# Patient Record
Sex: Female | Born: 2000 | Race: White | Hispanic: No | Marital: Single | Smoking: Never smoker
Health system: Southern US, Community
[De-identification: ages and names within clinical notes are randomized; demographics above are authoritative.]

---

## 2019-07-07 ENCOUNTER — Ambulatory Visit: Payer: Self-pay | Attending: Internal Medicine

## 2020-03-10 ENCOUNTER — Emergency Department (INDEPENDENT_AMBULATORY_CARE_PROVIDER_SITE_OTHER): Payer: BC Managed Care – PPO

## 2020-03-10 ENCOUNTER — Emergency Department (INDEPENDENT_AMBULATORY_CARE_PROVIDER_SITE_OTHER)
Admission: EM | Admit: 2020-03-10 | Discharge: 2020-03-10 | Disposition: A | Discharge status: Home / Self Care | Payer: BC Managed Care – PPO | Source: Home / Self Care | Attending: Family Medicine | Admitting: Family Medicine

## 2020-03-10 ENCOUNTER — Other Ambulatory Visit: Payer: Self-pay

## 2020-03-10 DIAGNOSIS — R059 Cough, unspecified: Secondary | ICD-10-CM | POA: Diagnosis not present

## 2020-03-10 DIAGNOSIS — R053 Chronic cough: Secondary | ICD-10-CM | POA: Diagnosis not present

## 2020-03-10 MED ORDER — METHYLPREDNISOLONE SODIUM SUCC 125 MG IJ SOLR
80.0000 mg | Freq: Once | INTRAMUSCULAR | Status: AC
  Administered 2020-03-10: 80 mg via INTRAMUSCULAR

## 2020-03-10 MED ORDER — BENZONATATE 200 MG PO CAPS: 200.0000 mg | ORAL_CAPSULE | Freq: Three times a day (TID) | ORAL | 0 refills | Status: AC | PRN

## 2020-03-10 MED ORDER — AZITHROMYCIN 250 MG PO TABS: ORAL_TABLET | ORAL | 0 refills | Status: DC

## 2020-03-10 MED ORDER — PREDNISONE 20 MG PO TABS: ORAL_TABLET | ORAL | 0 refills | Status: DC

## 2020-03-10 NOTE — ED Provider Notes (Signed)
[facility]    CSN: [id number] Arrival date & time: 03/10/20  1054      History   Chief Complaint Chief Complaint  Patient presents with  . Cough    HPI Jodi Carter is a 19 y.o. female.   About 6 weeks ago patient developed URI-like symptoms with initial sore throat and nasal congestion with copious nasal drainage that has persisted.  She was evaluated and treated last month with antibiotic but her cough has also persisted.  The cough has now become worse; she often coughs until she gags.  She denies fevers, chills, and sweats and pleuritic pain or shortness of breath  Her last Tdap was 03/19/2010 ([facility]).  The history is provided by the patient.    History reviewed. No pertinent past medical history.  There are no problems to display for this patient.   History reviewed. No pertinent surgical history.  OB History   No obstetric history on file.      Home Medications    Prior to Admission medications   Medication Sig Start Date End Date Taking? Authorizing Provider  azithromycin (ZITHROMAX Z-PAK) 250 MG tablet Take 2 tabs today; then begin one tab once daily for 4 more days. 03/10/20   Lattie Haw, MD  benzonatate (TESSALON) 200 MG capsule Take 1 capsule (200 mg total) by mouth 3 (three) times daily as needed for up to 10 days for cough. 03/10/20 03/20/20  Lattie Haw, MD  predniSONE (DELTASONE) 20 MG tablet Take one tab by mouth twice daily for 4 days, then one daily for 3 days. Take with food. 03/10/20   Lattie Haw, MD    Family History History reviewed. No pertinent family history.  Social History Social History   Tobacco Use  . Smoking status: Never Smoker  . Smokeless tobacco: Never Used  Vaping Use  . Vaping Use: Never used  Substance Use Topics  . Alcohol use: Yes    Comment: occ  . Drug use: Not on file     Allergies   Patient has no known allergies.   Review of Systems Review of Systems + sore throat,  resolved + cough No pleuritic pain No wheezing + nasal congestion + post-nasal drainage No sinus pain/pressure No itchy/red eyes No earache No hemoptysis No SOB No fever/chills No nausea No vomiting No abdominal pain No diarrhea No urinary symptoms No skin rash + fatigue No myalgias No headache Used OTC meds (Mucinex DM) without relief   Physical Exam Triage Vital Signs ED Triage Vitals [03/10/20 1315]  Enc Vitals Group     BP 120/78     Pulse Rate 84     Resp 18     Temp 98.3 F (36.8 C)     Temp Source Oral     SpO2 100 %     Weight      Height      Head Circumference      Peak Flow      Pain Score 0     Pain Loc      Pain Edu?      Excl. in GC?    No data found.  Updated Vital Signs BP 120/78 (BP Location: Right Arm)   Pulse 84   Temp 98.3 F (36.8 C) (Oral)   Resp 18   LMP 02/22/2020 (Exact Date)   SpO2 100%   Visual Acuity Right Eye Distance:   Left Eye Distance:   Bilateral Distance:    Right  Eye Near:   Left Eye Near:    Bilateral Near:     Physical Exam Nursing notes and Vital Signs reviewed. Appearance:  Patient appears stated age, and in no acute distress Eyes:  Pupils are equal, round, and reactive to light and accomodation.  Extraocular movement is intact.  Conjunctivae are not inflamed  Ears:  Canals normal.  Tympanic membranes normal.  Nose:  Mildly congested turbinates.  No sinus tenderness.  Pharynx:  Normal Neck:  Supple.  No adenopathy   Lungs:  Clear to auscultation.  Breath sounds are equal.  Moving air well. Heart:  Regular rate and rhythm without murmurs, rubs, or gallops.  Abdomen:  Nontender without masses or hepatosplenomegaly.  Bowel sounds are present.  No CVA or flank tenderness.  Extremities:  No edema.  Skin:  No rash present.   UC Treatments / Results  Labs (all labs ordered are listed, but only abnormal results are displayed) Labs Reviewed - No data to display  EKG   Radiology No results  found.  Procedures Procedures (including critical care time)  Medications Ordered in UC Medications  methylPREDNISolone sodium succinate (SOLU-MEDROL) 125 mg/2 mL injection 80 mg (80 mg Intramuscular Given 03/10/20 1415)    Initial Impression / Assessment and Plan / UC Course  I have reviewed the triage vital signs and the nursing notes.  Pertinent labs & imaging results that were available during my care of the patient were reviewed by me and considered in my medical decision making (see chart for details).    Suspect pertussis.  Administered Solumedrol 80mg  IM, then begin prednisone burst/taper. Begin Z-pack. Followup with Family Doctor if not improved in about 10 days.   Final Clinical Impressions(s) / UC Diagnoses   Final diagnoses:  Persistent cough     Discharge Instructions     Begin prednisone Sunday 03/11/20. May take Delsym Cough Suppressant ("12 Hour Cough Relief") at bedtime with Tessalon for nighttime cough.   Recommend a Tdap (tetanus shot) when well.     ED Prescriptions    Medication Sig Dispense Auth. Provider   azithromycin (ZITHROMAX Z-PAK) 250 MG tablet Take 2 tabs today; then begin one tab once daily for 4 more days. 6 tablet 03/13/20, MD   predniSONE (DELTASONE) 20 MG tablet Take one tab by mouth twice daily for 4 days, then one daily for 3 days. Take with food. 11 tablet Lattie Haw, MD   benzonatate (TESSALON) 200 MG capsule Take 1 capsule (200 mg total) by mouth 3 (three) times daily as needed for up to 10 days for cough. 30 capsule Lattie Haw, MD        Lattie Haw, MD 03/19/20 1444

## 2020-03-10 NOTE — ED Triage Notes (Signed)
Pt c/o cough x 1 month. Seen and treated with antibiotics and prednisone last month.Resolved mostly then returned within the last week. Went to ENT last month, ordered to use nasal spray for 90 days. Bloodwork drawn yesterday. F/u scheduled for Wed but patient still having problems with cough. Claritin, Mucinex DM and Tylenol Sinus prn.

## 2020-03-10 NOTE — Discharge Instructions (Addendum)
Begin prednisone Sunday 03/11/20. May take Delsym Cough Suppressant ("12 Hour Cough Relief") at bedtime with Tessalon for nighttime cough.   Recommend a Tdap (tetanus shot) when well.

## 2020-03-11 ENCOUNTER — Ambulatory Visit: Payer: Self-pay

## 2020-03-13 ENCOUNTER — Telehealth: Payer: Self-pay | Admitting: Emergency Medicine

## 2020-03-13 NOTE — Telephone Encounter (Signed)
Patient called and was confused that a nurse she talked to yesterday advised her she did not need an ABT.Marland KitchenThere are no notes to this affect. I advised her that her DX was Pertussis and Dr Cathren Harsh would not have given her an antibiotic unless her felt she needed it.

## 2020-06-15 ENCOUNTER — Other Ambulatory Visit: Payer: Self-pay

## 2020-06-15 ENCOUNTER — Emergency Department (INDEPENDENT_AMBULATORY_CARE_PROVIDER_SITE_OTHER)
Admission: RE | Admit: 2020-06-15 | Discharge: 2020-06-15 | Disposition: A | Discharge status: Home / Self Care | Payer: BC Managed Care – PPO | Source: Ambulatory Visit

## 2020-06-15 VITALS — BP 112/71 | HR 75 | Temp 98.5°F | Resp 18 | Ht 66.0 in | Wt 140.0 lb

## 2020-06-15 DIAGNOSIS — B2790 Infectious mononucleosis, unspecified without complication: Secondary | ICD-10-CM

## 2020-06-15 DIAGNOSIS — J029 Acute pharyngitis, unspecified: Secondary | ICD-10-CM

## 2020-06-15 LAB — POCT RAPID STREP A (OFFICE): Rapid Strep A Screen: NEGATIVE

## 2020-06-15 LAB — POCT MONO SCREEN (KUC): Mono, POC: POSITIVE — AB

## 2020-06-15 NOTE — Discharge Instructions (Addendum)
Can use tylenol for comfort if pain persist   Warm salt water gargles as needed for comfort  Otc throat lozenges as needed  Otc chloraseptic spray as needed for comfort  Follow up at Urgent care if pain increases or worsens, fever returns, fatigued, interferes with ability to eat/drink,

## 2020-06-15 NOTE — ED Triage Notes (Signed)
Fever on Monday and Tuesday, sore throat started yesterday. Vaccinated

## 2020-06-15 NOTE — ED Provider Notes (Signed)
[facility]    CSN: [id number] Arrival date & time: 06/15/20  1023      History   Chief Complaint Chief Complaint  Patient presents with  . Sore Throat    HPI Jodi Carter is a 20 y.o. female.   Patient presents with sore throat starting yesterday afternoon, pain increased this morning noticed tonsils swollen with Suzy Kugel patches. Pain with swallowing. Tolerating food and liquids.  Fever on Monday and Tuesday peaking at 100.0. Taken aleve for relief. Denies cough, body aches, chills, sneezing, sinus pressure, ear pain, visual changes, headaches, diarrhea, abdominal pain, shortness of breath.   History reviewed. No pertinent past medical history.  There are no problems to display for this patient.   History reviewed. No pertinent surgical history.  OB History   No obstetric history on file.      Home Medications    Prior to Admission medications   Not on File    Family History Family History  Problem Relation Age of Onset  . Crohn's disease Mother   . Healthy Father     Social History Social History   Tobacco Use  . Smoking status: Never Smoker  . Smokeless tobacco: Never Used  Vaping Use  . Vaping Use: Never used  Substance Use Topics  . Alcohol use: Yes    Comment: occ     Allergies   Patient has no known allergies.   Review of Systems Review of Systems  Constitutional: Positive for fever. Negative for activity change, appetite change, chills, diaphoresis, fatigue and unexpected weight change.  HENT: Positive for sore throat. Negative for congestion, dental problem, drooling, ear discharge, ear pain, facial swelling, hearing loss, mouth sores, nosebleeds, postnasal drip, rhinorrhea, sinus pressure, sinus pain, sneezing, tinnitus, trouble swallowing and voice change.   Eyes: Negative.   Respiratory: Negative.   Cardiovascular: Negative.   Gastrointestinal: Negative.   Skin: Negative.      Physical Exam Triage Vital Signs ED Triage  Vitals  Enc Vitals Group     BP 06/15/20 1038 112/71     Pulse Rate 06/15/20 1038 75     Resp 06/15/20 1038 18     Temp 06/15/20 1038 98.5 F (36.9 C)     Temp Source 06/15/20 1038 Oral     SpO2 06/15/20 1038 99 %     Weight 06/15/20 1039 140 lb (63.5 kg)     Height 06/15/20 1039 5\' 6"  (1.676 m)     Head Circumference --      Peak Flow --      Pain Score 06/15/20 1039 7     Pain Loc --      Pain Edu? --      Excl. in GC? --    No data found.  Updated Vital Signs BP 112/71 (BP Location: Right Arm)   Pulse 75   Temp 98.5 F (36.9 C) (Oral)   Resp 18   Ht 5\' 6"  (1.676 m)   Wt 140 lb (63.5 kg)   LMP 05/29/2020 (Exact Date)   SpO2 99%   BMI 22.60 kg/m   Visual Acuity Right Eye Distance:   Left Eye Distance:   Bilateral Distance:    Right Eye Near:   Left Eye Near:    Bilateral Near:     Physical Exam Constitutional:      Appearance: She is well-developed and normal weight.  HENT:     Head: Normocephalic.     Right Ear: Tympanic membrane and ear canal  normal.     Left Ear: Tympanic membrane and ear canal normal.     Nose: No congestion or rhinorrhea.     Mouth/Throat:     Mouth: Mucous membranes are moist.     Pharynx: Oropharynx is clear. Uvula midline. Posterior oropharyngeal erythema present.     Tonsils: Tonsillar exudate present. No tonsillar abscesses. 1+ on the right. 2+ on the left.  Eyes:     Conjunctiva/sclera: Conjunctivae normal.     Pupils: Pupils are equal, round, and reactive to light.  Cardiovascular:     Rate and Rhythm: Normal rate and regular rhythm.     Heart sounds: Normal heart sounds.  Pulmonary:     Effort: Pulmonary effort is normal.     Breath sounds: Normal breath sounds.  Musculoskeletal:     Cervical back: Normal range of motion and neck supple.  Skin:    General: Skin is warm and dry.  Neurological:     Mental Status: She is alert and oriented to person, place, and time.  Psychiatric:        Mood and Affect: Mood normal.         Behavior: Behavior normal.      UC Treatments / Results  Labs (all labs ordered are listed, but only abnormal results are displayed) Labs Reviewed  POCT RAPID STREP A (OFFICE)    EKG   Radiology No results found.  Procedures Procedures (including critical care time)  Medications Ordered in UC Medications - No data to display  Initial Impression / Assessment and Plan / UC Course  I have reviewed the triage vital signs and the nursing notes.  Pertinent labs & imaging results that were available during my care of the patient were reviewed by me and considered in my medical decision making (see chart for details).   Infectious Mononucleosis   1. Rapid strep- negative 2. Mono spot- positive  3. COVID test- declined 4. Warm salt water gargles  5. Throat lozenges as needed 6. otc chloraseptic spray as needed 7. Discussed when to follow up and signs of worsening infection, verbalized understanding  Final Clinical Impressions(s) / UC Diagnoses   Final diagnoses:  Acute pharyngitis, unspecified etiology   Discharge Instructions   None    ED Prescriptions    None     PDMP not reviewed this encounter.   Valinda Hoar, NP 06/15/20 1149

## 2021-05-28 IMAGING — DX DG CHEST 2V
2 series · 2 of 2 positions shown · non-contrast
Comparison: None.

CLINICAL DATA: 19-year-old with cough.

EXAM:
CHEST - 2 VIEW

[chest pa]
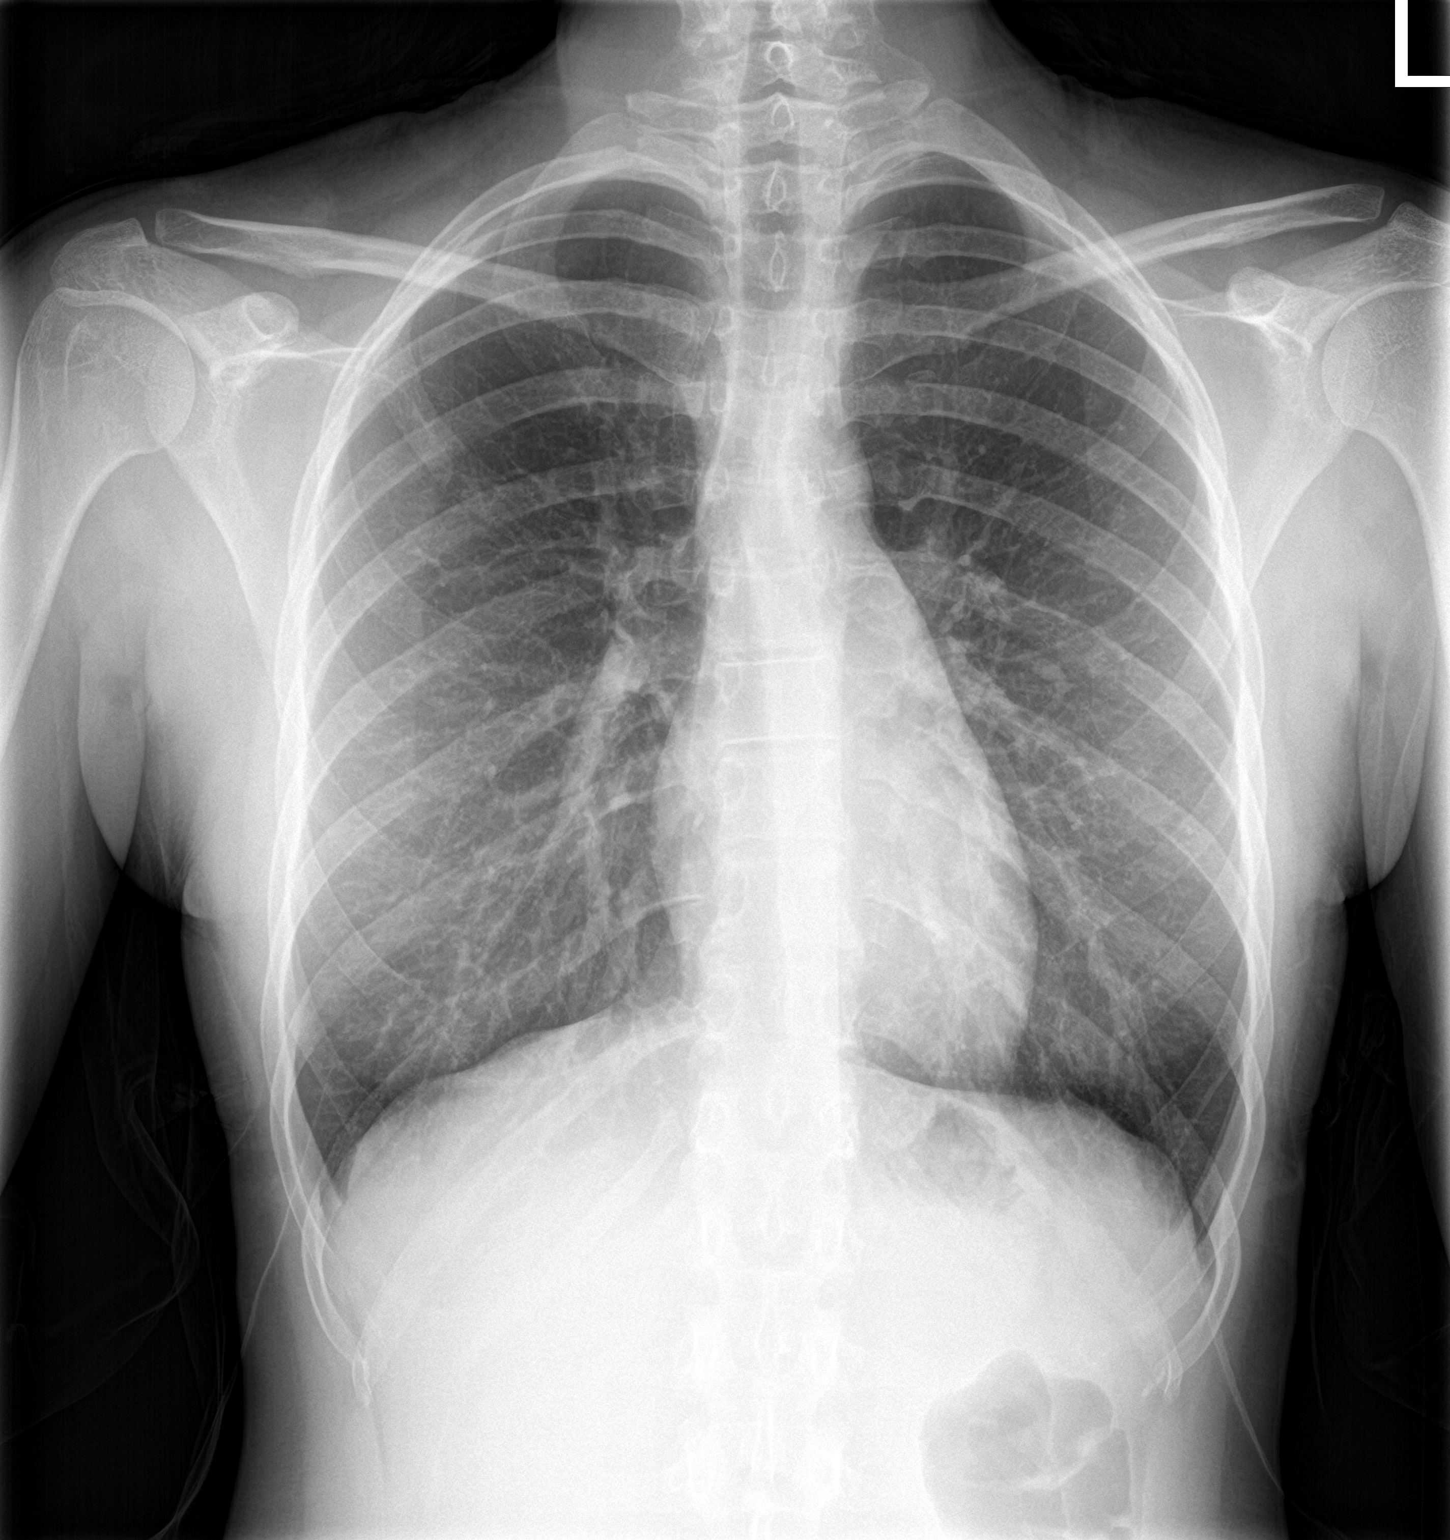

[chest lat]
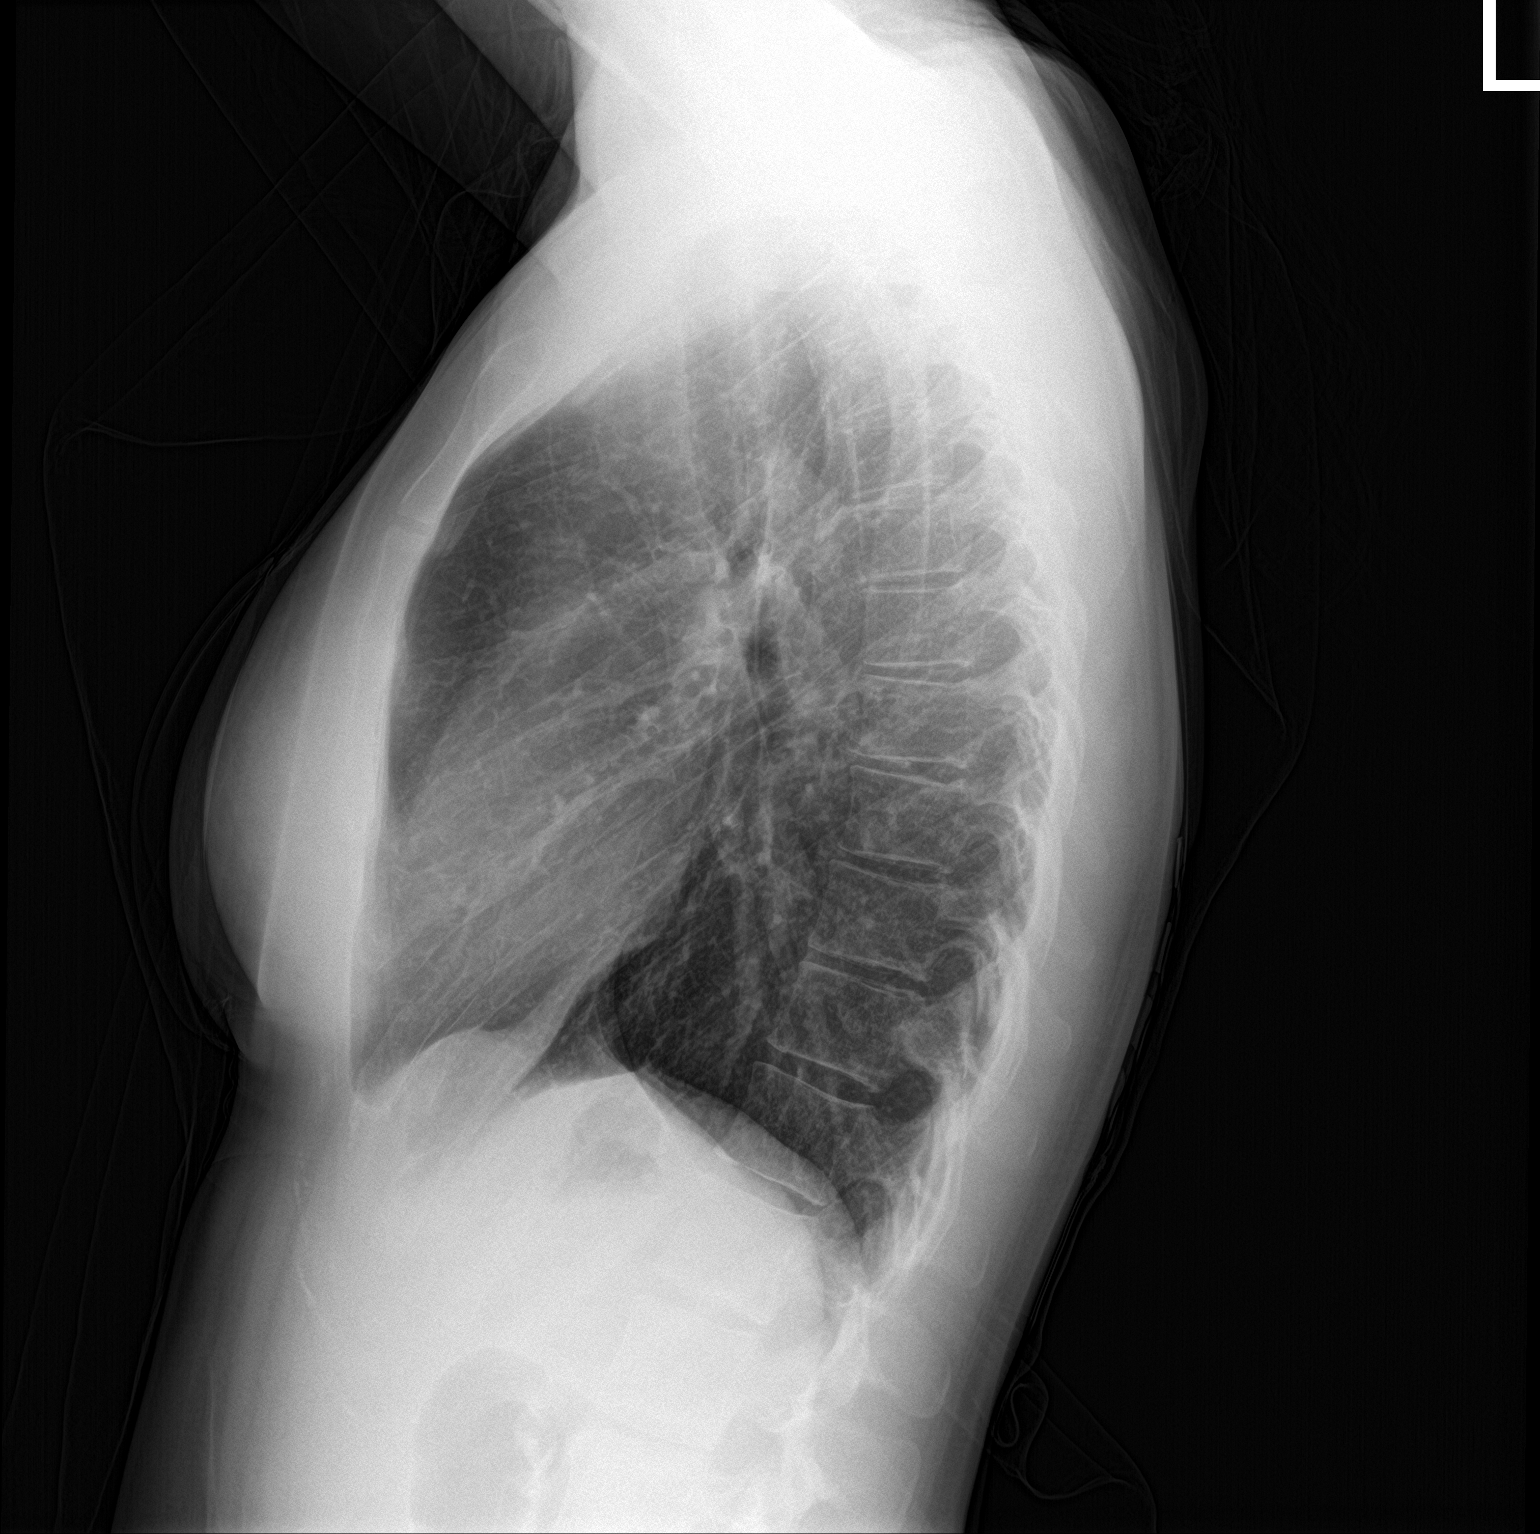

[2 of 2 positions shown; findings below may reference images not displayed]

FINDINGS: The heart size and mediastinal contours are within normal limits.
Both lungs are clear. The visualized skeletal structures are
unremarkable. Negative for a pneumothorax.
IMPRESSION: No active cardiopulmonary disease.
